# Patient Record
Sex: Male | Born: 1965 | Race: Black or African American | Hispanic: No | Smoking: Current every day smoker
Health system: Southern US, Community
[De-identification: ages and names within clinical notes are randomized; demographics above are authoritative.]

## PROBLEM LIST (undated history)

## (undated) DIAGNOSIS — I10 Essential (primary) hypertension: Secondary | ICD-10-CM

---

## 1998-12-15 ENCOUNTER — Emergency Department (HOSPITAL_COMMUNITY): Admission: EM | Admit: 1998-12-15 | Discharge: 1998-12-15 | Payer: Self-pay | Admitting: Emergency Medicine

## 1998-12-15 ENCOUNTER — Encounter: Payer: Self-pay | Admitting: Emergency Medicine

## 1999-03-24 ENCOUNTER — Emergency Department (HOSPITAL_COMMUNITY): Admission: EM | Admit: 1999-03-24 | Discharge: 1999-03-24 | Payer: Self-pay | Admitting: Emergency Medicine

## 1999-08-20 ENCOUNTER — Emergency Department (HOSPITAL_COMMUNITY): Admission: EM | Admit: 1999-08-20 | Discharge: 1999-08-20 | Payer: Self-pay | Admitting: Emergency Medicine

## 2007-04-09 ENCOUNTER — Emergency Department (HOSPITAL_COMMUNITY): Admission: EM | Admit: 2007-04-09 | Discharge: 2007-04-09 | Payer: Self-pay | Admitting: Emergency Medicine

## 2007-06-19 ENCOUNTER — Emergency Department (HOSPITAL_COMMUNITY): Admission: EM | Admit: 2007-06-19 | Discharge: 2007-06-19 | Payer: Self-pay | Admitting: Emergency Medicine

## 2008-11-06 ENCOUNTER — Emergency Department (HOSPITAL_COMMUNITY): Admission: EM | Admit: 2008-11-06 | Discharge: 2008-11-06 | Payer: Self-pay | Admitting: Emergency Medicine

## 2009-01-26 ENCOUNTER — Emergency Department (HOSPITAL_COMMUNITY): Admission: EM | Admit: 2009-01-26 | Discharge: 2009-01-26 | Payer: Self-pay | Admitting: Emergency Medicine

## 2010-11-14 LAB — CBC
HCT: 42 % (ref 39.0–52.0)
Hemoglobin: 13.7 g/dL (ref 13.0–17.0)
MCHC: 32.7 g/dL (ref 30.0–36.0)
MCV: 80.7 fL (ref 78.0–100.0)
Platelets: 190 10*3/uL (ref 150–400)
RBC: 5.2 MIL/uL (ref 4.22–5.81)
RDW: 15.4 % (ref 11.5–15.5)
WBC: 10.5 10*3/uL (ref 4.0–10.5)

## 2010-11-14 LAB — POCT I-STAT, CHEM 8
BUN: 9 mg/dL (ref 6–23)
Calcium, Ion: 1.13 mmol/L (ref 1.12–1.32)
Chloride: 103 mEq/L (ref 96–112)
Creatinine, Ser: 1 mg/dL (ref 0.4–1.5)
Glucose, Bld: 87 mg/dL (ref 70–99)
HCT: 42 % (ref 39.0–52.0)
Hemoglobin: 14.3 g/dL (ref 13.0–17.0)
Potassium: 3.9 mEq/L (ref 3.5–5.1)
Sodium: 137 mEq/L (ref 135–145)
TCO2: 26 mmol/L (ref 0–100)

## 2010-11-14 LAB — DIFFERENTIAL
Basophils Absolute: 0 10*3/uL (ref 0.0–0.1)
Basophils Relative: 0 % (ref 0–1)
Eosinophils Absolute: 0.2 10*3/uL (ref 0.0–0.7)
Eosinophils Relative: 2 % (ref 0–5)
Lymphocytes Relative: 16 % (ref 12–46)
Lymphs Abs: 1.6 10*3/uL (ref 0.7–4.0)
Monocytes Absolute: 0.6 10*3/uL (ref 0.1–1.0)
Monocytes Relative: 5 % (ref 3–12)
Neutro Abs: 8.2 10*3/uL — ABNORMAL HIGH (ref 1.7–7.7)
Neutrophils Relative %: 78 % — ABNORMAL HIGH (ref 43–77)

## 2014-04-24 ENCOUNTER — Encounter (HOSPITAL_COMMUNITY): Payer: Self-pay | Admitting: Emergency Medicine

## 2014-04-24 ENCOUNTER — Emergency Department (HOSPITAL_COMMUNITY)
Admission: EM | Admit: 2014-04-24 | Discharge: 2014-04-24 | Disposition: A | Discharge status: Home / Self Care | Payer: Self-pay | Attending: Emergency Medicine | Admitting: Emergency Medicine

## 2014-04-24 DIAGNOSIS — M5430 Sciatica, unspecified side: Secondary | ICD-10-CM | POA: Insufficient documentation

## 2014-04-24 DIAGNOSIS — Y9289 Other specified places as the place of occurrence of the external cause: Secondary | ICD-10-CM | POA: Insufficient documentation

## 2014-04-24 DIAGNOSIS — R209 Unspecified disturbances of skin sensation: Secondary | ICD-10-CM | POA: Insufficient documentation

## 2014-04-24 DIAGNOSIS — F17200 Nicotine dependence, unspecified, uncomplicated: Secondary | ICD-10-CM | POA: Insufficient documentation

## 2014-04-24 DIAGNOSIS — X500XXA Overexertion from strenuous movement or load, initial encounter: Secondary | ICD-10-CM | POA: Insufficient documentation

## 2014-04-24 DIAGNOSIS — Y990 Civilian activity done for income or pay: Secondary | ICD-10-CM | POA: Insufficient documentation

## 2014-04-24 DIAGNOSIS — IMO0002 Reserved for concepts with insufficient information to code with codable children: Secondary | ICD-10-CM | POA: Insufficient documentation

## 2014-04-24 DIAGNOSIS — M5442 Lumbago with sciatica, left side: Secondary | ICD-10-CM

## 2014-04-24 DIAGNOSIS — I10 Essential (primary) hypertension: Secondary | ICD-10-CM | POA: Insufficient documentation

## 2014-04-24 DIAGNOSIS — Y939 Activity, unspecified: Secondary | ICD-10-CM | POA: Insufficient documentation

## 2014-04-24 HISTORY — DX: Essential (primary) hypertension: I10

## 2014-04-24 MED ORDER — METHOCARBAMOL 500 MG PO TABS: 500.0000 mg | ORAL_TABLET | Freq: Two times a day (BID) | ORAL | Status: AC

## 2014-04-24 MED ORDER — HYDROCHLOROTHIAZIDE 25 MG PO TABS: 25.0000 mg | ORAL_TABLET | Freq: Every day | ORAL | Status: AC

## 2014-04-24 MED ORDER — HYDROCODONE-ACETAMINOPHEN 5-325 MG PO TABS: 1.0000 | ORAL_TABLET | Freq: Four times a day (QID) | ORAL | Status: AC | PRN

## 2014-04-24 MED ORDER — ENALAPRIL MALEATE 10 MG PO TABS: 10.0000 mg | ORAL_TABLET | Freq: Every day | ORAL | Status: AC

## 2014-04-24 NOTE — Discharge Instructions (Signed)
°Emergency Department Resource Guide °1) Find a Doctor and Pay Out of Pocket °Although you won't have to find out who is covered by your insurance plan, it is a good idea to ask around and get recommendations. You will then need to call the office and see if the doctor you have chosen will accept you as a new patient and what types of options they offer for patients who are self-pay. Some doctors offer discounts or will set up payment plans for their patients who do not have insurance, but you will need to ask so you aren't surprised when you get to your appointment. ° °2) Contact Your Local Health Department °Not all health departments have doctors that can see patients for sick visits, but many do, so it is worth a call to see if yours does. If you don't know where your local health department is, you can check in your phone book. The CDC also has a tool to help you locate your state's health department, and many state websites also have listings of all of their local health departments. ° °3) Find a Walk-in Clinic °If your illness is not likely to be very severe or complicated, you may want to try a walk in clinic. These are popping up all over the country in pharmacies, drugstores, and shopping centers. They're usually staffed by nurse practitioners or physician assistants that have been trained to treat common illnesses and complaints. They're usually fairly quick and inexpensive. However, if you have serious medical issues or chronic medical problems, these are probably not your best option. ° °No Primary Care Doctor: °- Call [facility] at  832-8000 - they can help you locate a primary care doctor that  accepts your insurance, provides certain services, etc. °- Physician Referral Service- 1-800-533-3463 ° °Chronic Pain Problems: °Organization         Address  Phone   Notes  °[facility]  (336) 297-2271 Patients need to be referred by their primary care doctor.  ° °Medication  Assistance: °Organization         Address  Phone   Notes  °[place] [facility] [address] °[place], [place] [address] (336) 641-8030 --Must be a resident of [place] °-- Must have NO insurance coverage whatsoever (no Medicaid/ Medicare, etc.) °-- The pt. MUST have a primary care doctor that directs their care regularly and follows them in the community °  °[facility]  (866) 331-1348   °[employer]  (888) 892-1162   ° °Agencies that provide inexpensive medical care: °Organization         Address  Phone   Notes  °[facility]  (336) 832-8035   °[facility]    (336) 832-7272   °[facility] [address] °[place], [place] [address] (336) 832-4777   °[facility] [address], ° (336) 271-4999   °[facility]    (336) 373-0678   °[facility]    (336) 272-1050   °[facility] ° [address],  Phone:  (336) 832-4444, Fax:  (336) 832-4440 Hours of Operation:  9 am - 6 pm, M-F.  Also accepts Medicaid/Medicare and self-pay.  °[facility] ° [address],  Phone: (336) 832-3150, Fax: (336) 832-3151. Hours of Operation:  8:30 am - 5:30 pm, M-F.  Also accepts Medicaid and self-pay.  °[facility] [place] [address], [place] Phone: (336) 878-6027   °[facility] [address], [place], [place] (336)723-1848, Ext. 123 Mondays & Thursdays: 7-9 AM.  First 15 patients are seen on a first come, first serve basis. °  ° °Medicaid-accepting [place] Providers: ° °Organization         Address  Phone   Notes  °[facility] [address], [place] (336) 641-2100 Also accepts self-pay patients.  °[facility] [address], [place] ° (336) 856-9996   °[facility] [address], [place]  (336) 288-8857   °[facility] [address] [place] [address], [place] (336) 299-7000   °Veita Bland [address], [place]  ° (336) 373-1557 Only accepts [place] Access Medicaid patients after they have their name applied to their card.  ° °Self-Pay (no insurance) in [place]: ° °Organization         Address  Phone   Notes  °Sickle Cell Patients, [facility] [address], [place] (336) 832-1970   °[facility] [address], [place] (336) 832-4400   °[facility] [place] ° [address], [place] (336) 992-4800   °[facility]/Dr. Osei-Bonsu ° [address] [place] [address], [place] or [address], [place] (336) 841-8500 Phone number for both [place] and [place] locations is the same.  °[facility] [address], [place] (336) 299-0000   °[facility] [address] [place] [address], [place] or [address] (336) 852-7530 °(336) 878-2260   °[facility] [address], [place] (336) 350-1642, phone; (336) 294-5005, fax Sees patients 1st and 3rd Saturday of every month.  Must not qualify for public or private insurance (i.e. Medicaid, Medicare, [place] Health Choice, Veterans' Benefits) • Household income should be no more than 200% of the poverty level •The clinic cannot treat you if you are pregnant or think you are pregnant • Sexually transmitted diseases are not treated at the clinic.  ° ° °Dental Care: °Organization         Address  Phone  Notes  °[place] [facility] [address], [place] (336) 641-6152 Accepts children up to age 21 who are enrolled in Medicaid or [place] Health Choice; pregnant women with a Medicaid card; and children who have applied for Medicaid or [place] Health Choice, but were declined, whose parents can pay a reduced fee at time of service.  °[place]  [facility] [place]  [address], [place] (336) 641-7733 Accepts children up to age 21 who are enrolled in Medicaid or [place] Health Choice; pregnant women with a Medicaid card; and children who have applied for Medicaid or [place] Health Choice, but were declined, whose parents can pay a reduced fee at time of service.  °[facility] ° [address],  (336) 641-4533 Patients are seen by appointment only. Walk-ins are not accepted. [facility] will see patients 18 years of age and older. °Monday - Tuesday (8am-5pm) °Most Wednesdays (8:30-5pm) °$30 per visit, cash only  °[facility] ° [address], [place] (336) 641-4533 Patients are seen by appointment only. Walk-ins are not accepted. [facility] will see patients 18 years of age and older. °One   Wednesday Evening (Monthly: Volunteer Based).  $30 per visit, cash only  °[facility]  (919) 537-3737 for adults; Children under age 4, call Graduate Pediatric Dentistry at (919) [phone]. Children aged 4-14, please call (919) [phone] to request a pediatric application. ° Dental services are provided in all areas of dental care including fillings, crowns and bridges, complete and partial dentures, implants, gum treatment, root canals, and extractions. Preventive care is also provided. Treatment is provided to both adults and children. °Patients are selected via a lottery and there is often a waiting list. °  °[facility] [address], °[place] ° (336) 763-8833 [email or web address] °  °[facility] [address], [place], [place] (336)723-1848, Ext. 123 Second and Fourth Thursday of each month, opens at 6:30 AM; Clinic ends at 9 AM.  Patients are seen on a first-come first-served basis, and a limited number are seen during each clinic.  ° °[facility] ° [address], [place], [place] (336) 723-7904    Eligibility Requirements °You must have lived in [place], [place], or [place] counties for at least the last three months. °  You cannot be eligible for state or federal sponsored healthcare insurance, including Veterans Administration, Medicaid, or Medicare. °  You generally cannot be eligible for healthcare insurance through your employer.  °  How to apply: °Eligibility screenings are held every Tuesday and Wednesday afternoon from 1:00 pm until 4:00 pm. You do not need an appointment for the interview!  °[facility] [address], [place], [place] 336-631-2330   °[place] [facility]  336-342-8273   °[facility]  336-703-3100   °[facility]  336-570-6415   ° °Behavioral Health Resources in the Community: °Intensive Outpatient Programs °Organization         Address  Phone  Notes  °[facility] [address], [place], [place] 336-878-6098   °[facility] [address], [place], [place] 336-832-9800   °[facility] [address], [place], [place] ° 336-882-2125   °[facility] [address],  °[place], [place] 1-800-853-5163 or 336-641-4981   °Substance Abuse Resources °Organization         Address  Phone  Notes  °[facility]  336-882-2125   °[facility]  336-784-9470   °[facility]  336-285-9073   °[facility]  336-845-3988   °Residential & Outpatient Substance Abuse Program  1-800-659-3381   °Psychological Services °Organization         Address  Phone  Notes  °[facility]  336- 832-9600   °[facility]  336- 378-7881   °[facility] [address], [place] 1-800-853-5163 or 336-641-4981   ° °Mobile Crisis Teams °Organization         Address  Phone  Notes  °[facility], Mobile Crisis Care Unit  1-877-626-1772   °Assertive °[facility] ° [address]  [place], [place] 336-834-9664   °Sharon DeEsch [address] °[place] 336-554-5454   ° °Self-Help/Support Groups °Organization         Address  Phone             Notes  °[facility] [place] - variety of support groups  336- 373-1402 Call for more information  °Narcotics Anonymous (NA), [facility] [address], °[place]  2 meetings at this location  ° °  Residential Treatment Programs °Organization         Address  Phone  Notes  °[facility] [address],    °[place]  1-866-801-8205   °[facility] ° [address], [place], [place] 704-293-8524   °[facility] [address], [place] 336-845-3988 Admissions: 8am-3pm M-F  °[facility] [address]    °[place], [place] 336-841-1104   °[facility] [address], [place], [place] 336-379-7146   °[facility] [address]  °[place], [place] 336-285-9073   °[facility] - Intensive Outpatient [address], [place], [place] 336-852-3033   °[facility] [address]  °[place], [place] 1-877-615-2722 or 336-784-9470   °[facility] [address] [place], [place] 336-227-7417 Accepts Medicaid  °[facility] [address]  °[place] 1-800-659-3381 Substance Abuse/Addiction Treatment  ° °[place] Behavioral Health Resources °Organization         Address  Phone  Notes  °[facility]  (888) 581-9988   °Julie Brannon, PhD [address] [place], [place]   (336) 349-5553 or (336) 951-0000   °[facility]   [address] °[place], [place] (336) 349-4454   °[facility] [address], [place], [place] (336) 342-8316 Insurance/Medicaid/sponsorship through [facility]  °[facility] [address]                                    [place], [place] (336) 342-8316 Therapy/tele-psych/case    °[facility] [address]  ° [place], [place] (336) 349-2233    °Dr. Arfeen  (336) 349-4544   °[facility] [place]  [employer] [place] [facility] 1) [address], [place] °2) [address], [place] °3)  [address], [place] (336) 349-3220 °(336) 342-7768 ° °(336) 342-8140   °[place] [facility] (336) 342-1394 or (336) 342-3537 (After Hours)    ° ° °

## 2014-04-24 NOTE — ED Notes (Addendum)
Pt comfortable with discharge and follow up instructions. Pt declines wheelchair, escorted to waiting area by this RN. Prescriptions x4. EDPA made aware of BP, states OK for discharge.

## 2014-04-24 NOTE — ED Provider Notes (Signed)
CSN: [id number]     Arrival date & time 04/24/14  1906 History  This chart was scribed for non-physician practitioner, Santiago Glad, PA-C,working with Toy Cookey, MD, by Karle Plumber, ED Scribe. This patient was seen in room TR06C/TR06C and the patient's care was started at 8:22 PM.  Chief Complaint  Patient presents with  . Back Pain   Patient is a 48 y.o. male presenting with back pain. The history is provided by the patient. No language interpreter was used.  Back Pain Associated symptoms: numbness   Associated symptoms: no fever    HPI Comments:  Bradley Ibarra is a 48 y.o. male who presents to the Emergency Department complaining of severe lower back pain that has been worsening over the past month. He states he was at work today, bent down and heard a popping noise from his back. Endorses that the pain radiates down the left leg and paresthesias of the left foot. He reports taking Tylenol and other unspecified OTC pain medications. He denies fever, chills, abdominal pain, numbness or tingling of the lower extremities. He denies h/o cancer, IV drug use or HIV.   Past Medical History  Diagnosis Date  . Hypertension    History reviewed. No pertinent past surgical history. No family history on file. History  Substance Use Topics  . Smoking status: Current Every Day Smoker  . Smokeless tobacco: Not on file  . Alcohol Use: No    Review of Systems  Constitutional: Negative for fever.  Musculoskeletal: Positive for back pain.  Neurological: Positive for numbness.    Allergies  Review of patient's allergies indicates no known allergies.  Home Medications   Prior to Admission medications   Not on File   Triage Vitals: BP 153/106  Pulse 84  Temp(Src) 98.4 F (36.9 C) (Oral)  Resp 16  Ht  (1.727 m)  Wt 155 lb (70.308 kg)  BMI 23.57 kg/m2  SpO2 98% Physical Exam  Nursing note and vitals reviewed. Constitutional: He is oriented to person, place, and time.  He appears well-developed and well-nourished.  HENT:  Head: Normocephalic and atraumatic.  Eyes: EOM are normal.  Neck: Normal range of motion.  Cardiovascular: Normal rate, regular rhythm and normal heart sounds.  Exam reveals no gallop and no friction rub.   No murmur heard.  DP pulses 2+ bilaterally.  Pulmonary/Chest: Effort normal and breath sounds normal. No respiratory distress. He has no wheezes. He has no rales.  Musculoskeletal: Normal range of motion. He exhibits tenderness. He exhibits no edema.  Tenderness to palpation of thoracic and lumbar spine. No overlying erythema, edema or warmth.   Neurological: He is alert and oriented to person, place, and time. He has normal strength.  Reflex Scores:      Patellar reflexes are 2+ on the right side and 2+ on the left side. Sensations intact bilaterally. Stiff gait secondary to pain but able to ambulate without difficulty.  Skin: Skin is warm and dry.  Psychiatric: He has a normal mood and affect. His behavior is normal.    ED Course  Procedures (including critical care time) DIAGNOSTIC STUDIES: Oxygen Saturation is 98% on RA, normal by my interpretation.   COORDINATION OF CARE: 8:28 PM- Will prescribe muscle relaxer, pain medication and advised pt to follow up with PCP for continued symptoms. Pt verbalizes understanding and agrees to plan.  Medications - No data to display  Labs Review Labs Reviewed - No data to display  Imaging Review No results found.  EKG Interpretation None      MDM   Final diagnoses:  None   Patient with back pain.  No neurological deficits and normal neuro exam.  Patient can walk but states is painful.  No loss of bowel or bladder control.  No concern for cauda equina.  No fever, night sweats, weight loss, h/o cancer, IVDU.  RICE protocol and pain medicine indicated and discussed with patient.  Patient stable for discharge.  Return precautions given.   I personally performed the services  described in this documentation, which was scribed in my presence. The recorded information has been reviewed and is accurate.    Santiago Glad, PA-C 04/24/14 2038

## 2014-04-24 NOTE — ED Notes (Signed)
Pt c/o lower  Back pain for one month.  He felt something pop initially pain since then

## 2014-04-24 NOTE — ED Notes (Signed)
He is supposed to take bp med but he has not taken any for 1-2 months

## 2014-04-25 NOTE — ED Provider Notes (Signed)
Medical screening examination/treatment/procedure(s) were performed by non-physician practitioner and as supervising physician I was immediately available for consultation/collaboration.  Megan Docherty, MD 04/25/14 1008 

## 2014-06-29 ENCOUNTER — Ambulatory Visit: Payer: Self-pay

## 2014-10-14 ENCOUNTER — Emergency Department (HOSPITAL_COMMUNITY): Payer: Self-pay

## 2014-10-14 ENCOUNTER — Encounter (HOSPITAL_COMMUNITY): Payer: Self-pay | Admitting: *Deleted

## 2014-10-14 ENCOUNTER — Emergency Department (HOSPITAL_COMMUNITY)
Admission: EM | Admit: 2014-10-14 | Discharge: 2014-10-14 | Disposition: A | Discharge status: Home / Self Care | Payer: Self-pay | Attending: Emergency Medicine | Admitting: Emergency Medicine

## 2014-10-14 DIAGNOSIS — Y9389 Activity, other specified: Secondary | ICD-10-CM | POA: Insufficient documentation

## 2014-10-14 DIAGNOSIS — Z720 Tobacco use: Secondary | ICD-10-CM | POA: Insufficient documentation

## 2014-10-14 DIAGNOSIS — S299XXA Unspecified injury of thorax, initial encounter: Secondary | ICD-10-CM | POA: Insufficient documentation

## 2014-10-14 DIAGNOSIS — Y9289 Other specified places as the place of occurrence of the external cause: Secondary | ICD-10-CM | POA: Insufficient documentation

## 2014-10-14 DIAGNOSIS — I10 Essential (primary) hypertension: Secondary | ICD-10-CM | POA: Insufficient documentation

## 2014-10-14 DIAGNOSIS — S0990XA Unspecified injury of head, initial encounter: Secondary | ICD-10-CM | POA: Insufficient documentation

## 2014-10-14 DIAGNOSIS — Y998 Other external cause status: Secondary | ICD-10-CM | POA: Insufficient documentation

## 2014-10-14 DIAGNOSIS — R0789 Other chest pain: Secondary | ICD-10-CM

## 2014-10-14 MED ORDER — NAPROXEN 500 MG PO TABS: 500.0000 mg | ORAL_TABLET | Freq: Two times a day (BID) | ORAL | Status: AC

## 2014-10-14 NOTE — Discharge Instructions (Signed)
Workup following this all negative for any significant injuries. Expect to be sore and stiff for the next few days. If headache persists beyond week follow-up with your doctor or return here. Head CT was negative CT of the neck was negative chest x-ray negative and left rib series was negative. Work note provided.
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
## 2015-01-05 ENCOUNTER — Inpatient Hospital Stay
Admit: 2015-01-05 | Discharge: 2015-01-05 | Disposition: A | Discharge status: Home / Self Care | Payer: Self-pay | Attending: Emergency Medicine

## 2015-01-05 DIAGNOSIS — I159 Secondary hypertension, unspecified: Secondary | ICD-10-CM

## 2015-01-05 MED ORDER — HYDROCHLOROTHIAZIDE 25 MG TAB
25 mg | Freq: Every day | ORAL | Status: DC
Start: 2015-01-05 — End: 2015-01-05
  Administered 2015-01-05: 13:00:00 via ORAL

## 2015-01-05 MED ORDER — HYDROCHLOROTHIAZIDE 25 MG TAB
25 mg | Freq: Every day | ORAL | Status: DC
Start: 2015-01-05 — End: 2015-01-05

## 2015-01-05 MED ORDER — ENALAPRIL MALEATE 10 MG TAB
10 mg | ORAL_TABLET | Freq: Every day | ORAL | Status: DC
Start: 2015-01-05 — End: 2015-02-03

## 2015-01-05 MED ORDER — ENALAPRIL MALEATE 10 MG TAB
10 mg | Freq: Every day | ORAL | Status: DC
Start: 2015-01-05 — End: 2015-01-05
  Administered 2015-01-05: 14:00:00 via ORAL

## 2015-01-05 MED ORDER — HYDROCHLOROTHIAZIDE 25 MG TAB
25 mg | ORAL_TABLET | Freq: Every day | ORAL | Status: DC
Start: 2015-01-05 — End: 2015-02-03

## 2015-01-05 MED FILL — ENALAPRIL MALEATE 10 MG TAB: 10 mg | ORAL | Qty: 1

## 2015-01-05 MED FILL — HYDROCHLOROTHIAZIDE 25 MG TAB: 25 mg | ORAL | Qty: 1

## 2015-01-05 NOTE — ED Provider Notes (Signed)
HPI Comments: 8:52 AM    Dean Pearson is a 49 y.o. male with a pmhx of HTN who presents to the ED with c/o dizziness and headache x 2 days. Patient states being non-compliant with his BP medication (Enalapril and Hydrothiazide) over the past 2 days and comes in now with c/o dizziness and headache x 2 days. He denies any changes in vision. No chest pain, SOB, nausea, or diarrhea. No slurred speech. Patient states that he does not have a PCP due to recently moving here a few weeks ago from [place], [place]. Patient admits to smoking a cigar occasionally. Patient states that he has been in here due to similar symptoms in the past for Hypertensive reasons. No other symptoms or complaints were presented at this time.     Written by Glendora Score, ED Scribe, as dictated by Jerre Simon, MD.    Patient is a 49 y.o. male presenting with headaches and hypertension. The history is provided by the patient.   Headache   This is a new problem. The current episode started 2 days ago. Associated symptoms include dizziness. Pertinent negatives include no nausea and no vomiting.   Hypertension   This is a recurrent problem. Associated symptoms include headaches and dizziness. Pertinent negatives include no chest pain, no nausea and no vomiting.        Past Medical History:   Diagnosis Date   ??? Hypertension        Past Surgical History:   Procedure Laterality Date   ??? Hx cholecystectomy           History reviewed. No pertinent family history.    History     Social History   ??? Marital Status: SINGLE     Spouse Name: N/A   ??? Number of Children: N/A   ??? Years of Education: N/A     Occupational History   ??? Not on file.     Social History Main Topics   ??? Smoking status: Current Some Day Smoker   ??? Smokeless tobacco: Not on file   ??? Alcohol Use: Not on file   ??? Drug Use: Not on file   ??? Sexual Activity: Not on file     Other Topics Concern   ??? Not on file     Social History Narrative   ??? No narrative on file        ALLERGIES: Review of patient's allergies indicates no known allergies.      Review of Systems   Eyes: Negative for visual disturbance (no changes in vision).   Respiratory: Negative for cough.    Cardiovascular: Negative for chest pain.   Gastrointestinal: Negative for nausea and vomiting.   Genitourinary: Negative for difficulty urinating.   Neurological: Positive for dizziness and headaches.       Filed Vitals:    01/05/15 0831   BP: 152/91   Pulse: 69   Temp: 98.2 ??F (36.8 ??C)   Resp: 14   Height:  (1.727 m)   Weight: 73.029 kg (161 lb)   SpO2: 98%            Physical Exam   Constitutional: He is oriented to person, place, and time. He appears well-developed.   HENT:   Head: Normocephalic and atraumatic.   Eyes: EOM are normal. Pupils are equal, round, and reactive to light.   Neck: Normal range of motion. Neck supple.   Cardiovascular: Normal rate, regular rhythm and normal heart sounds.  Exam  reveals no friction rub.    No murmur heard.  Pulmonary/Chest: Effort normal and breath sounds normal. No respiratory distress. He has no wheezes.   Musculoskeletal: Normal range of motion.   Neurological: He is alert and oriented to person, place, and time.   Skin: Skin is warm and dry.   Psychiatric: He has a normal mood and affect. His behavior is normal. Thought content normal.   Nursing note and vitals reviewed.     RESULTS:    EKG FINDING  Rhythm: normal sinus rhythm; rate. 69 bpm; Other findings: No STEMI;  Read by Chelsea PrimusMark Keneisha Heckart, MD at 8:42 AM.  Written by Kristopher OppenheimPriscilla Nguyen, ED Scribe, as dictated by Jerre SimonMark A Alphonsine Minium, MD.     No orders to display        Labs Reviewed - No data to display    No results found for this or any previous visit (from the past 12 hour(s)).    MDM  Number of Diagnoses or Management Options     Amount and/or Complexity of Data Reviewed  Tests in the medicine section of CPT??: ordered and reviewed (EKG)  Independent visualization of images, tracings, or specimens: yes (EKG)      Medications    enalapril (VASOTEC) tablet 10 mg (not administered)   hydrochlorothiazide (HYDRODIURIL) tablet 25 mg (not administered)     Procedures    PROGRESS NOTE:  8:52 AM  Initial assessment performed.  Written by Glendora ScorePriscilla P Nguyen, ED Scribe, as dictated by Jerre SimonMark A Dianah Pruett, MD.    PROGRESS NOTE:  9:15 AM  Patient is feeling better.  Written by Glendora ScorePriscilla P Nguyen, ED Scribe, as dictated by Jerre SimonMark A Jairen Goldfarb, MD.    DISCHARGE NOTE:  9:16 AM   Dean Pearson's  results have been reviewed with him.  He has been counseled regarding his diagnosis, treatment, and plan.  He verbally conveys understanding and agreement of the signs, symptoms, diagnosis, treatment and prognosis and additionally agrees to follow up as discussed.  He also agrees with the care-plan and conveys that all of his questions have been answered.  I have also provided discharge instructions for him that include: educational information regarding their diagnosis and treatment, and list of reasons why they would want to return to the ED prior to their follow-up appointment, should his condition change.    The patient and/or family have been provided with education for proper Emergency Department utilization.    CLINICAL IMPRESSION:    1. Secondary hypertension, hypertension with unspecified goal        PLAN: DISCHARGE HOME    Follow-up Information     Follow up With Details Comments Contact Info    [facility] In 2 days  [address]  [place] [id number]  [place] [id number]  [phone]    [facility]  As needed, If symptoms worsen [address]  [place] [id number]  [phone]          Current Discharge Medication List      START taking these medications    Details   enalapril (VASOTEC) 10 mg tablet Take 1 Tab by mouth daily.  Qty: 30 Tab, Refills: 0      hydrochlorothiazide (HYDRODIURIL) 25 mg tablet Take 1 Tab by mouth daily.  Qty: 30 Tab, Refills: 0             SCRIBE ATTESTATION:     This note was prepared by Kristopher OppenheimPriscilla Nguyen acting as Scribe for and  in the presence of Jerre Simon, MD.    Jerre Simon, MD: The scribe's documentation has been prepared under my direction and personally reviewed by me in its entirety. I confirm that the note above accurately reflects all work, treatment, procedures, and medical decision making performed by me.    Written by Glendora Score, ED Scribe, as dictated by Jerre Simon, MD.

## 2015-01-05 NOTE — ED Notes (Signed)
LC spoke with patient regarding follow up. Patient stated that he is new to the area, just obtained employment and will receive insurance after probation period. LC spoke with him about follow up options available to him in the interim. Patient decided that he would like to [facility] up with [facility] for the next three months to maintain his chronic condition. I have provided the patient with a calendar, explained how the clinic works, the number to call to schedule an appointment and provided my contact information in case further assistance is needed. LC also provided patient with medication coupons to assist in the cost of discharge medication. LC will follow up with the patient accordingly.

## 2015-01-05 NOTE — ED Notes (Signed)
Discharged to home.  Pt denies further questions. Armband removed and shredded.

## 2015-01-05 NOTE — ED Notes (Addendum)
C/o HA, dizziness, visual disturbances x2 days. Describes headache as throbbing pain. States "I think my blood pressure is high. I've been out of my medicine for a few weeks". Pt states new to area, no PCP.     Sepsis Screening completed    (  )Patient meets SIRS criteria.  (x )Patient does not meet SIRS criteria.      SIRS Criteria is achieved when two or more of the following are present  ? Temperature < 96.8??F (36??C) or > 100.9??F (38.3??C)  ? Heart Rate > 90 beats per minute  ? Respiratory Rate > 20 beats per minute  ? WBC count > 12,000 or <4,000 or > 10% bands      (  )Patient has a suspected source of infection.  (x )Patient does not have a suspected source of infection.

## 2015-01-06 LAB — EKG, 12 LEAD, INITIAL
Atrial Rate: 69 {beats}/min
Calculated P Axis: 68 degrees
Calculated R Axis: 86 degrees
Calculated T Axis: 64 degrees
Diagnosis: NORMAL
P-R Interval: 176 ms
Q-T Interval: 406 ms
QRS Duration: 112 ms
QTC Calculation (Bezet): 435 ms
Ventricular Rate: 69 {beats}/min

## 2015-02-03 ENCOUNTER — Inpatient Hospital Stay
Admit: 2015-02-03 | Discharge: 2015-02-03 | Disposition: A | Discharge status: Home / Self Care | Payer: Self-pay | Attending: Internal Medicine

## 2015-02-03 DIAGNOSIS — J209 Acute bronchitis, unspecified: Secondary | ICD-10-CM

## 2015-02-03 MED ORDER — AZITHROMYCIN 250 MG TAB
250 mg | ORAL_TABLET | ORAL | Status: AC
Start: 2015-02-03 — End: 2015-02-08

## 2015-02-03 MED ORDER — HYDROCHLOROTHIAZIDE 25 MG TAB
25 mg | ORAL_TABLET | Freq: Every day | ORAL | Status: AC
Start: 2015-02-03 — End: 2015-02-17

## 2015-02-03 MED ORDER — ENALAPRIL MALEATE 10 MG TAB
10 mg | ORAL_TABLET | Freq: Every day | ORAL | Status: AC
Start: 2015-02-03 — End: 2015-02-17

## 2015-02-03 MED ORDER — CODEINE-GUAIFENESIN 10 MG-100 MG/5 ML ORAL LIQUID
100-10 mg/5 mL | Freq: Three times a day (TID) | ORAL | Status: AC | PRN
Start: 2015-02-03 — End: ?

## 2015-02-03 NOTE — ED Notes (Signed)
Pt d/c to home.  Pt educated on all new Rx.    Pt verbalizes understanding and all questions answered.

## 2015-02-03 NOTE — ED Notes (Addendum)
Pt complains of three days of cough, congestion in head and nose, fatigue.  Pt also reports needing a refill on his blood pressure medication.

## 2015-02-03 NOTE — ED Provider Notes (Signed)
HPI Comments:   10:58 AM  Dean Pearson is a 49 y.o. male presenting to the ED C/O progressively worsening productive cough x 3 days. He began noticing spots of blood in the sputum yesterday. Other sxs include sore throat, tinnitus, nasal congestion, generalized body aches, and fatigue. At-home treatment includes cough medicine without relief. PMHx includes HTN for which he takes HCTZ. Pt admits tobacco use (cigars twice daily). Pt denies sick contact and any other sxs or complaints.      Written by Brunilda Payor, ED Scribe, as dictated by Zebedee Iba, PA-C       Patient is a 49 y.o. male presenting with cough. The history is provided by the patient. No language interpreter was used.   Cough  This is a new problem. The current episode started 2 days ago. The problem has been gradually worsening. The cough is productive of bloody sputum (since yesterday). Associated symptoms include sore throat and myalgias. He has tried cough syrup for the symptoms.        Past Medical History:   Diagnosis Date   ??? Hypertension        Past Surgical History:   Procedure Laterality Date   ??? Hx cholecystectomy           History reviewed. No pertinent family history.    History     Social History   ??? Marital Status: SINGLE     Spouse Name: N/A   ??? Number of Children: N/A   ??? Years of Education: N/A     Occupational History   ??? Not on file.     Social History Main Topics   ??? Smoking status: Current Some Day Smoker   ??? Smokeless tobacco: Not on file   ??? Alcohol Use: Yes      Comment: occasionally   ??? Drug Use: No   ??? Sexual Activity: Not on file     Other Topics Concern   ??? Not on file     Social History Narrative         ALLERGIES: Review of patient's allergies indicates no known allergies.    Review of Systems   Constitutional: Positive for fatigue.   HENT: Positive for congestion, sore throat and tinnitus.    Respiratory: Positive for cough.    Musculoskeletal: Positive for myalgias and arthralgias.    All other systems reviewed and are negative.      Filed Vitals:    02/03/15 1041   BP: 148/108   Pulse: 77   Temp: 97.6 ??F (36.4 ??C)   Resp: 18   Height:  (1.727 m)   Weight: 77.565 kg (171 lb)   SpO2: 96%            Physical Exam   Nursing note and vitals reviewed.   Vital signs and nursing notes reviewed.    CONSTITUTIONAL: Alert. Well-appearing; well-nourished; in no apparent distress.  HEAD: Normocephalic; atraumatic.  EYES: PERRL; Conjunctiva clear.   ENT: TM's normal. External ear normal. Normal nose; no rhinorrhea. Normal pharynx. Tonsils not enlarged without exudate. Moist mucus membranes.  NECK: Supple; FROM without difficulty, non-tender; no cervical lymphadenopathy.             CV: Normal S1, S2; no murmurs, rubs, or gallops. No chest wall tenderness.  RESPIRATORY: Normal chest excursion with respiration; breath sounds clear and equal bilaterally; no wheezes, rhonchi, or rales.  SKIN: Normal for age and race; warm; dry; good turgor; no apparent lesions or exudate.  NEURO: A & O x3.   PSYCH:  Mood and affect appropriate.       RESULTS:    No orders to display        Labs Reviewed - No data to display    No results found for this or any previous visit (from the past 12 hour(s)).     MDM  Number of Diagnoses or Management Options    Medications - No data to display     Procedures    PROGRESS NOTE:  10:58 AM  Initial assessment performed.  Written by Brunilda PayorNathan Dawang, ED Scribe, as dictated by Zebedee IbaMarica Betoney, PA-C     DISCHARGE NOTE:  11:13 AM    Dean Pearson's  results have been reviewed with him.  He has been counseled regarding his diagnosis, treatment, and plan.  He verbally conveys understanding and agreement of the signs, symptoms, diagnosis, treatment and prognosis and additionally agrees to follow up as discussed.  He also agrees with the care-plan and conveys that all of his questions have been answered.  I have also provided discharge instructions for him  that include: educational information regarding their diagnosis and treatment, and list of reasons why they would want to return to the ED prior to their follow-up appointment, should his condition change. The patient and/or family has been provided with education for proper Emergency Department utilization.    CLINICAL IMPRESSION:    1. Acute bronchitis, unspecified organism    2. Medication refill        PLAN: DISCHARGE HOME    Follow-up Information     Follow up With Details Comments Contact Info    [facility] days For follow up [address]  [place] [id number]  [place] [id number]  [phone]    [facility]  As needed, If symptoms worsen [address]  [place] [id number]  [phone]          Current Discharge Medication List      START taking these medications    Details   guaiFENesin-codeine (CHERATUSSIN AC) 100-10 mg/5 mL solution Take 5 mL by mouth three (3) times daily as needed for Cough. Max Daily Amount: 15 mL.  Qty: 120 mL, Refills: 0      azithromycin (ZITHROMAX Z-PAK) 250 mg tablet Use per pack instructions.  Qty: 6 Tab, Refills: 0         CONTINUE these medications which have CHANGED    Details   enalapril (VASOTEC) 10 mg tablet Take 1 Tab by mouth daily for 14 days.  Qty: 14 Tab, Refills: 0      hydrochlorothiazide (HYDRODIURIL) 25 mg tablet Take 1 Tab by mouth daily for 14 days.  Qty: 14 Tab, Refills: 0             ATTESTATIONS:  This note is prepared by Brunilda PayorNathan Dawang, acting as Scribe for [employer]ica Betoney, PA-C.    Zebedee IbaMarica Betoney, PA-C : The scribe's documentation has been prepared under my direction and personally reviewed by me in its entirety. I confirm that the note above accurately reflects all work, treatment, procedures, and medical decision making performed by me.
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
